# Patient Record
Sex: Female | Born: 1960 | Hispanic: Yes | Marital: Married | Smoking: Never smoker
Health system: Southern US, Community
[De-identification: ages and names within clinical notes are randomized; demographics above are authoritative.]

## PROBLEM LIST (undated history)

## (undated) DIAGNOSIS — E785 Hyperlipidemia, unspecified: Secondary | ICD-10-CM

## (undated) DIAGNOSIS — E079 Disorder of thyroid, unspecified: Secondary | ICD-10-CM

---

## 1999-12-27 ENCOUNTER — Other Ambulatory Visit: Admission: RE | Admit: 1999-12-27 | Discharge: 1999-12-27 | Payer: Self-pay | Admitting: Family Medicine

## 2018-03-29 ENCOUNTER — Other Ambulatory Visit (HOSPITAL_COMMUNITY): Payer: Self-pay | Admitting: *Deleted

## 2018-03-29 DIAGNOSIS — Z1231 Encounter for screening mammogram for malignant neoplasm of breast: Secondary | ICD-10-CM

## 2018-07-04 ENCOUNTER — Ambulatory Visit
Admission: RE | Admit: 2018-07-04 | Discharge: 2018-07-04 | Disposition: A | Discharge status: Home / Self Care | Payer: No Typology Code available for payment source | Source: Ambulatory Visit | Attending: Obstetrics and Gynecology | Admitting: Obstetrics and Gynecology

## 2018-07-04 ENCOUNTER — Encounter (HOSPITAL_COMMUNITY): Payer: Self-pay

## 2018-07-04 ENCOUNTER — Ambulatory Visit (HOSPITAL_COMMUNITY)
Admission: RE | Admit: 2018-07-04 | Discharge: 2018-07-04 | Disposition: A | Discharge status: Home / Self Care | Payer: Self-pay | Source: Ambulatory Visit | Attending: Obstetrics and Gynecology | Admitting: Obstetrics and Gynecology

## 2018-07-04 VITALS — BP 120/84

## 2018-07-04 DIAGNOSIS — Z1239 Encounter for other screening for malignant neoplasm of breast: Secondary | ICD-10-CM

## 2018-07-04 DIAGNOSIS — Z1231 Encounter for screening mammogram for malignant neoplasm of breast: Secondary | ICD-10-CM

## 2018-07-04 HISTORY — DX: Disorder of thyroid, unspecified: E07.9

## 2018-07-04 HISTORY — DX: Hyperlipidemia, unspecified: E78.5

## 2018-07-04 NOTE — Progress Notes (Signed)
Complaints of a right axillary lump since she was 58 years old. Patient states her axillary area has been tender to the touch since she was 58 years old. Patient rates the pain at a 2 out of 10.  Pap Smear: Pap smear not completed today. Last Pap smear was in October 2019 at the free clinic in [place] and normal per patient. Per patient has no history of an abnormal Pap smear. No Pap smear results are in Epic.  Physical exam: Breasts Breasts symmetrical. No skin abnormalities bilateral breasts. No nipple retraction bilateral breasts. No nipple discharge bilateral breasts. No lymphadenopathy. No lumps palpated bilateral breasts. No lumps palpated in patients area of concern. No complaints of pain or tenderness on exam. Referred patient to the [facility] for a screening mammogram. Appointment scheduled for Thursday, July 04, 2018 at 0940.        Pelvic/Bimanual No Pap smear completed today since last Pap smear was in October 2019 per patient. Pap smear not indicated per [facility] guidelines.   Smoking History: Patient has never smoked.  Patient Navigation: Patient education provided. Access to services provided for patient through [facility] program. Spanish interpreter provided.   Colorectal Cancer Screening: Per patient has never had a colonoscopy completed. No complaints today. FIT Test given to patient to complete and return to [facility].  Breast and Cervical Cancer Risk Assessment: Patient has no family history of breast cancer, known genetic mutations, or radiation treatment to the chest before age 64. Patient has no history of cervical dysplasia, immunocompromised, or DES exposure in-utero.  Risk Assessment    Risk Scores      07/04/2018   Last edited by: Lynnell Dike, LPN   5-year risk: 0.9 %   Lifetime risk: 5.1 %         Used Spanish interpreter Natale Lay from [employer].

## 2018-07-04 NOTE — Patient Instructions (Signed)
Explained breast self awareness with Emily Burton. Patient did not need a Pap smear today due to last Pap smear was in October 2019 per patient. Let her know [facility] will cover Pap smears every 3 years unless has a history of abnormal Pap smears. Referred patient to the [facility] for a screening mammogram. Appointment scheduled for Thursday, July 04, 2018 at 0940. Patient aware of appointment and will be there. Let patient know the Breast Center will follow up with her within the next couple weeks with results of mammogram by letter or phone. Emily Burton verbalized understanding.  Emily Burton, Emily Maser, RN 9:02 AM

## 2018-07-10 ENCOUNTER — Encounter (HOSPITAL_COMMUNITY): Payer: Self-pay | Admitting: *Deleted

## 2018-07-30 ENCOUNTER — Other Ambulatory Visit: Payer: Self-pay

## 2018-08-01 LAB — SPECIMEN STATUS REPORT

## 2018-08-01 LAB — FECAL OCCULT BLOOD, IMMUNOCHEMICAL: FECAL OCCULT BLD: NEGATIVE

## 2018-08-15 ENCOUNTER — Encounter (HOSPITAL_COMMUNITY): Payer: Self-pay

## 2018-08-15 NOTE — Progress Notes (Signed)
Mailed FIT Test result letter to patient on 08/15/2018. Negative FIT Test result.

## 2019-07-29 ENCOUNTER — Other Ambulatory Visit (HOSPITAL_COMMUNITY): Payer: Self-pay | Admitting: *Deleted

## 2019-07-29 DIAGNOSIS — Z1231 Encounter for screening mammogram for malignant neoplasm of breast: Secondary | ICD-10-CM

## 2019-08-13 ENCOUNTER — Ambulatory Visit (HOSPITAL_COMMUNITY): Payer: No Typology Code available for payment source

## 2019-08-14 ENCOUNTER — Other Ambulatory Visit: Payer: Self-pay

## 2019-08-14 ENCOUNTER — Ambulatory Visit (HOSPITAL_COMMUNITY)
Admission: RE | Admit: 2019-08-14 | Discharge: 2019-08-14 | Disposition: A | Discharge status: Home / Self Care | Payer: PRIVATE HEALTH INSURANCE | Source: Ambulatory Visit | Attending: *Deleted | Admitting: *Deleted

## 2019-08-14 DIAGNOSIS — Z1231 Encounter for screening mammogram for malignant neoplasm of breast: Secondary | ICD-10-CM | POA: Diagnosis not present

## 2019-08-18 ENCOUNTER — Other Ambulatory Visit (HOSPITAL_COMMUNITY): Payer: Self-pay | Admitting: *Deleted

## 2019-08-18 DIAGNOSIS — R921 Mammographic calcification found on diagnostic imaging of breast: Secondary | ICD-10-CM

## 2019-08-29 ENCOUNTER — Other Ambulatory Visit (HOSPITAL_COMMUNITY): Payer: Self-pay | Admitting: *Deleted

## 2019-08-29 DIAGNOSIS — R921 Mammographic calcification found on diagnostic imaging of breast: Secondary | ICD-10-CM

## 2019-09-02 ENCOUNTER — Ambulatory Visit (HOSPITAL_COMMUNITY)
Admission: RE | Admit: 2019-09-02 | Discharge: 2019-09-02 | Disposition: A | Discharge status: Home / Self Care | Payer: PRIVATE HEALTH INSURANCE | Source: Ambulatory Visit | Attending: *Deleted | Admitting: *Deleted

## 2019-09-02 ENCOUNTER — Other Ambulatory Visit: Payer: Self-pay

## 2019-09-02 DIAGNOSIS — R921 Mammographic calcification found on diagnostic imaging of breast: Secondary | ICD-10-CM

## 2021-08-18 IMAGING — MG DIGITAL SCREENING BILAT W/ TOMO W/ CAD
6 of 10 series · 6 of 30 positions shown · non-contrast
Comparison: Prior films

CLINICAL DATA: Screening.

EXAM:
DIGITAL SCREENING BILATERAL MAMMOGRAM WITH TOMO AND CAD

[R MLO synth-2D (1 of 2)]
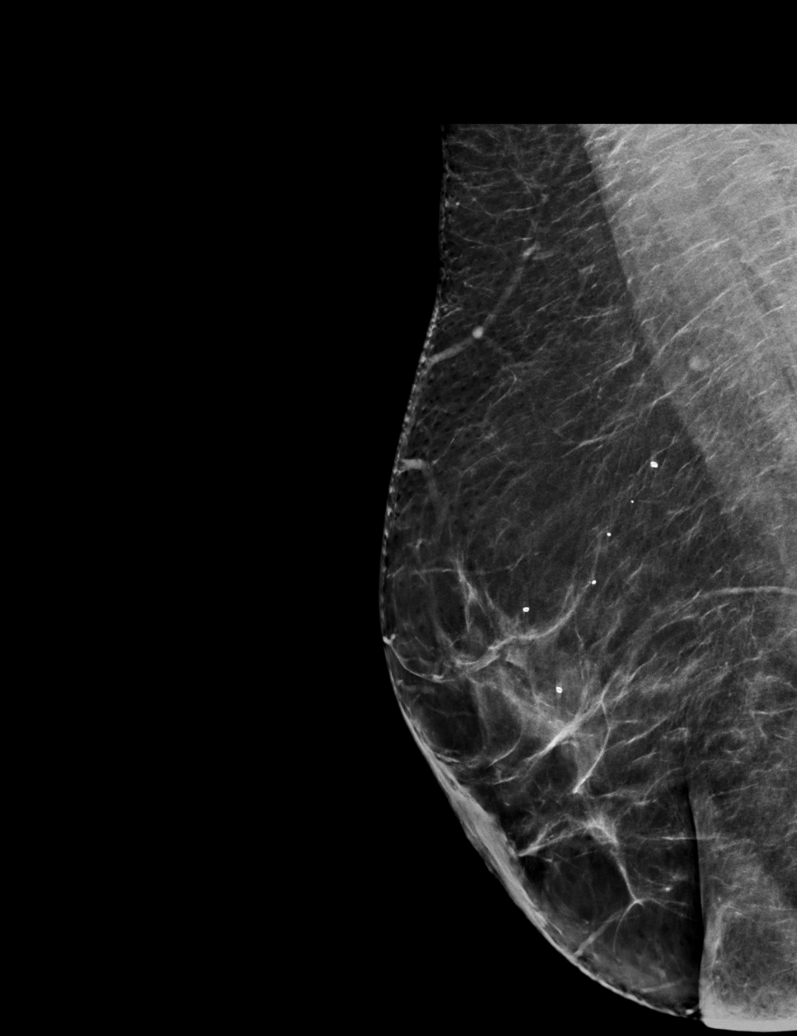

[R MLO synth-2D (2 of 2)]
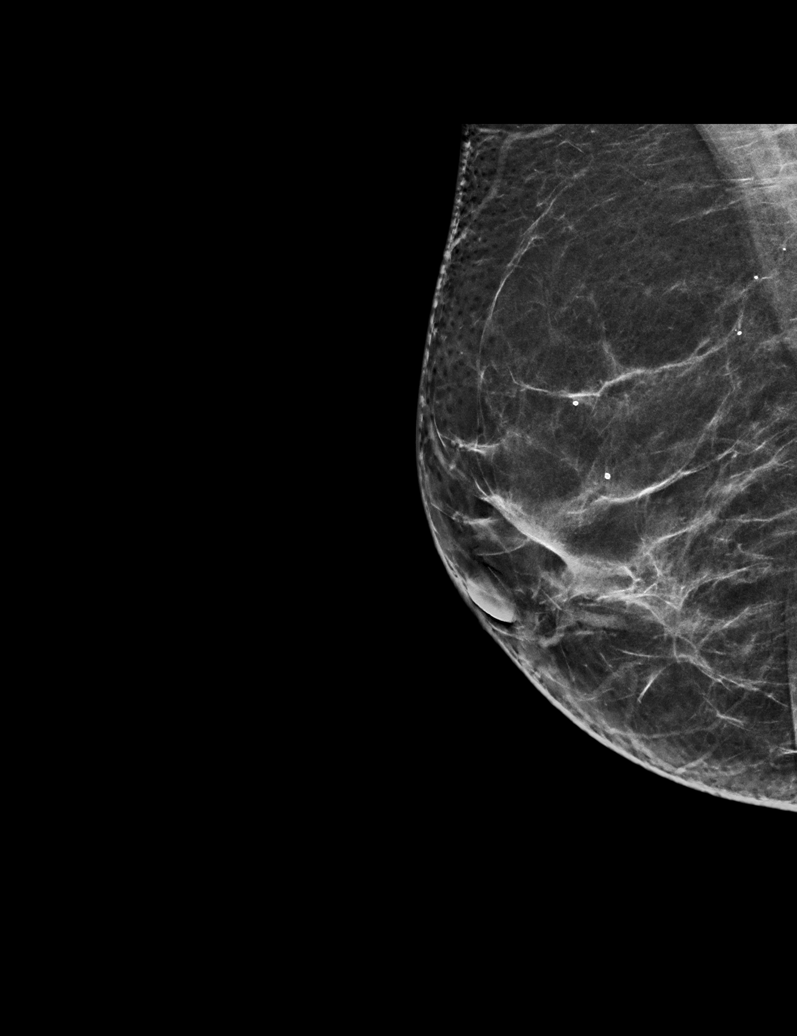

[R CC synth-2D]
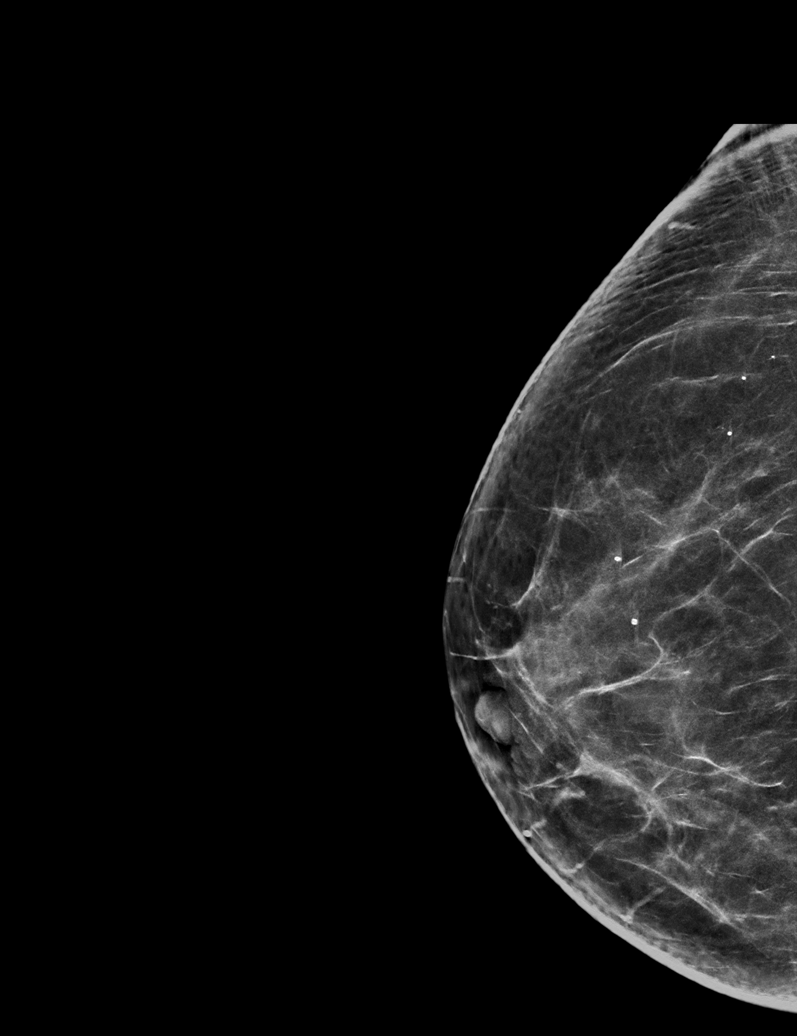

[L CC synth-2D]
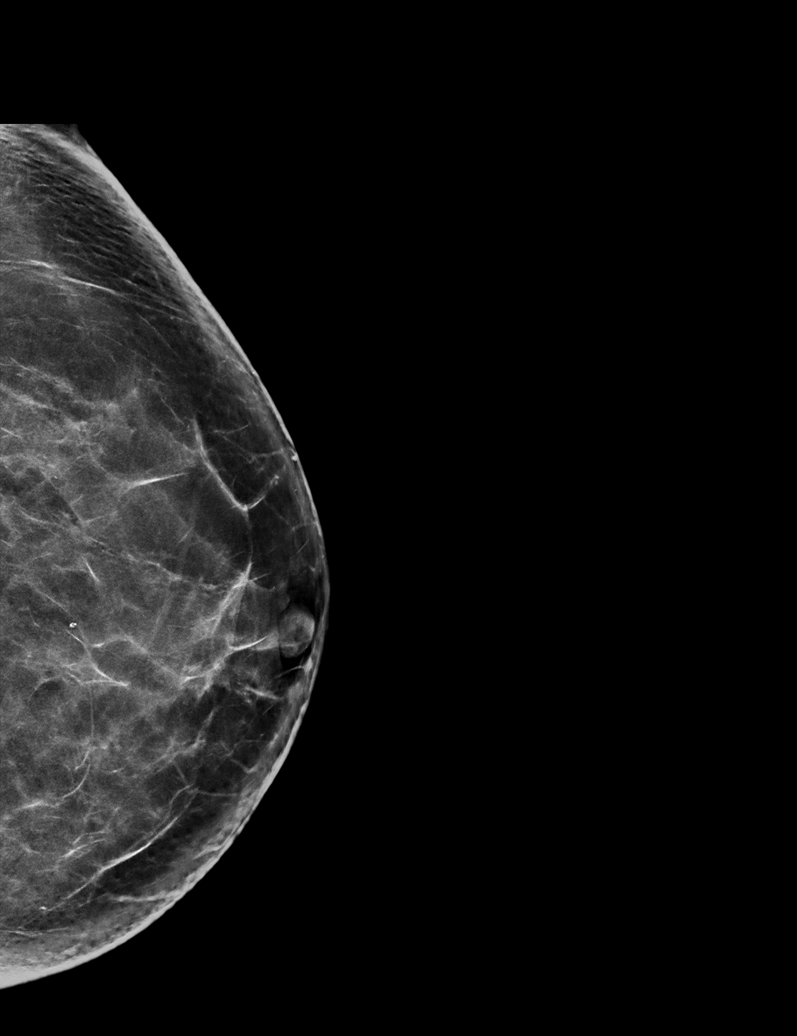

[L MLO synth-2D]
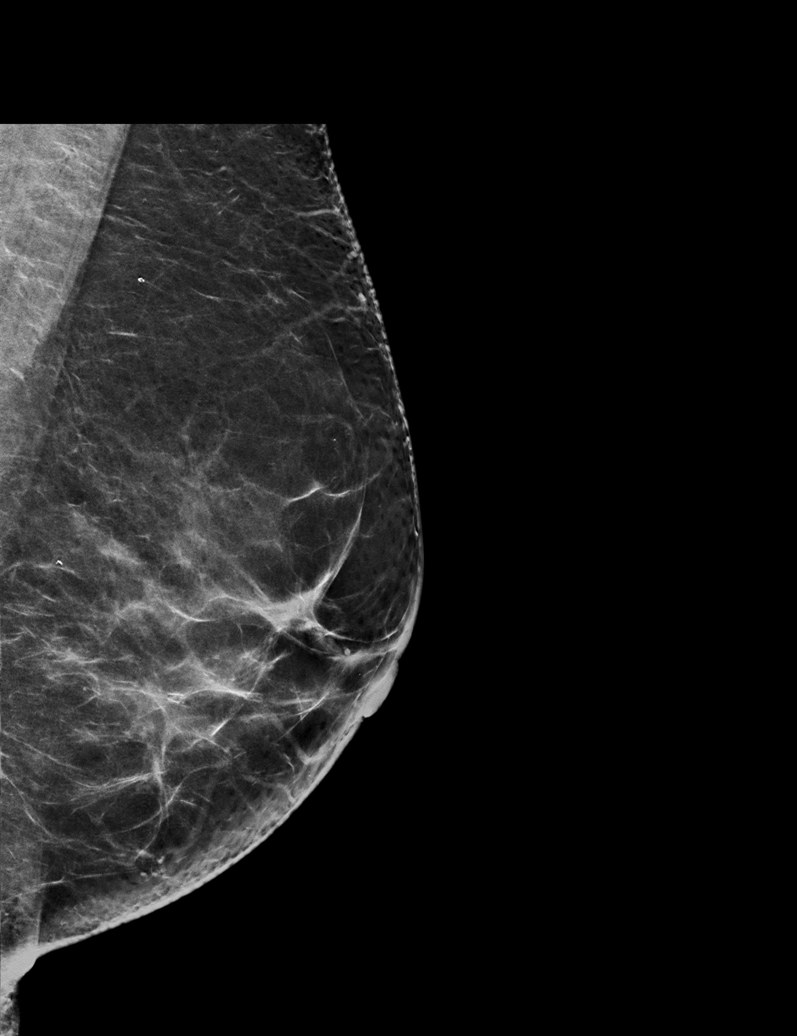

[R MLO tomo · tomo slice 33/64.0]
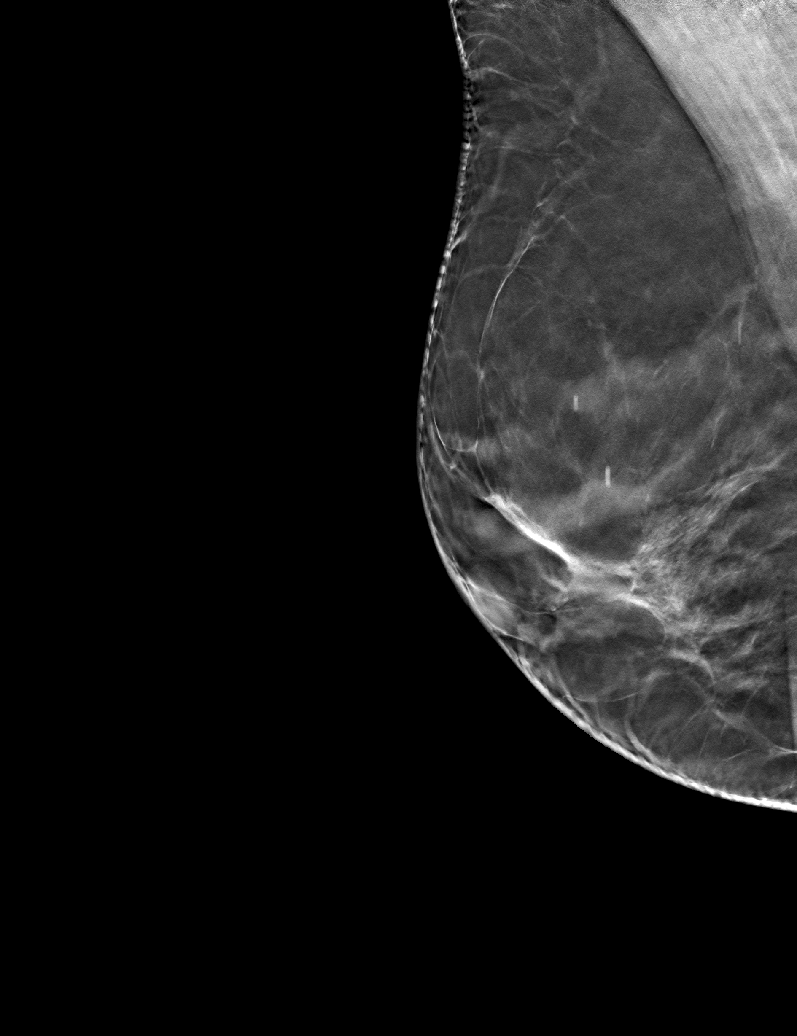

[6 of 30 positions shown; findings below may reference images not displayed]

ACR Breast Density Category b: There are scattered areas of
fibroglandular density.
FINDINGS: In the right breast, calcifications warrant further evaluation with
magnified views. In the left breast, no findings suspicious for
malignancy. Images were processed with CAD.
IMPRESSION: Further evaluation is suggested for calcifications in the right
breast.

RECOMMENDATION:
Diagnostic mammogram of the right breast. (Code:[id number])

The patient will be contacted regarding the findings, and additional
imaging will be scheduled.

BI-RADS CATEGORY  0: Incomplete. Need additional imaging evaluation
and/or prior mammograms for comparison.

## 2023-02-12 ENCOUNTER — Telehealth: Payer: Self-pay

## 2023-02-12 NOTE — Telephone Encounter (Signed)
Attempted follow up/Wellness call, with interpreter services, [facility], Her [facility] expired on 02/09/23 she was mailed renewal letter on 12/12/22.  [facility] was 08/25/22 she rescheduled an appointment from 02/22/23 till 03/08/23.    Francee Nodal RN [employer]
